# Patient Record
Sex: Female | Born: 1995
Health system: Southern US, Community
[De-identification: ages and names within clinical notes are randomized; demographics above are authoritative.]

## PROBLEM LIST (undated history)

## (undated) DIAGNOSIS — J45909 Unspecified asthma, uncomplicated: Secondary | ICD-10-CM

## (undated) HISTORY — PX: ADENOIDECTOMY: SUR15

## (undated) HISTORY — PX: OTHER SURGICAL HISTORY: SHX169

---
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
## 2016-07-23 DIAGNOSIS — M25532 Pain in left wrist: Secondary | ICD-10-CM | POA: Diagnosis not present

## 2016-07-30 DIAGNOSIS — M25532 Pain in left wrist: Secondary | ICD-10-CM | POA: Diagnosis not present

## 2016-08-06 DIAGNOSIS — M778 Other enthesopathies, not elsewhere classified: Secondary | ICD-10-CM | POA: Diagnosis not present

## 2016-08-13 DIAGNOSIS — M25532 Pain in left wrist: Secondary | ICD-10-CM | POA: Diagnosis not present

## 2016-09-15 DIAGNOSIS — D225 Melanocytic nevi of trunk: Secondary | ICD-10-CM | POA: Diagnosis not present

## 2016-09-15 DIAGNOSIS — L814 Other melanin hyperpigmentation: Secondary | ICD-10-CM | POA: Diagnosis not present

## 2016-09-15 DIAGNOSIS — D1801 Hemangioma of skin and subcutaneous tissue: Secondary | ICD-10-CM | POA: Diagnosis not present

## 2016-10-05 DIAGNOSIS — N390 Urinary tract infection, site not specified: Secondary | ICD-10-CM | POA: Diagnosis not present

## 2017-01-08 DIAGNOSIS — H6692 Otitis media, unspecified, left ear: Secondary | ICD-10-CM | POA: Diagnosis not present

## 2017-01-08 DIAGNOSIS — J00 Acute nasopharyngitis [common cold]: Secondary | ICD-10-CM | POA: Diagnosis not present

## 2017-01-08 DIAGNOSIS — R05 Cough: Secondary | ICD-10-CM | POA: Diagnosis not present

## 2017-08-27 DIAGNOSIS — L309 Dermatitis, unspecified: Secondary | ICD-10-CM | POA: Diagnosis not present

## 2017-08-31 DIAGNOSIS — Z01419 Encounter for gynecological examination (general) (routine) without abnormal findings: Secondary | ICD-10-CM | POA: Diagnosis not present

## 2017-09-07 ENCOUNTER — Emergency Department (HOSPITAL_COMMUNITY): Payer: 59

## 2017-09-07 ENCOUNTER — Emergency Department (HOSPITAL_COMMUNITY)
Admission: EM | Admit: 2017-09-07 | Discharge: 2017-09-07 | Disposition: A | Discharge status: Home / Self Care | Payer: 59 | Attending: Emergency Medicine | Admitting: Emergency Medicine

## 2017-09-07 ENCOUNTER — Encounter (HOSPITAL_COMMUNITY): Payer: Self-pay | Admitting: Emergency Medicine

## 2017-09-07 ENCOUNTER — Other Ambulatory Visit: Payer: Self-pay

## 2017-09-07 DIAGNOSIS — N831 Corpus luteum cyst of ovary, unspecified side: Secondary | ICD-10-CM

## 2017-09-07 DIAGNOSIS — N8311 Corpus luteum cyst of right ovary: Secondary | ICD-10-CM | POA: Diagnosis not present

## 2017-09-07 DIAGNOSIS — Z79899 Other long term (current) drug therapy: Secondary | ICD-10-CM | POA: Diagnosis not present

## 2017-09-07 DIAGNOSIS — R109 Unspecified abdominal pain: Secondary | ICD-10-CM | POA: Diagnosis not present

## 2017-09-07 DIAGNOSIS — R1030 Lower abdominal pain, unspecified: Secondary | ICD-10-CM | POA: Diagnosis not present

## 2017-09-07 LAB — COMPREHENSIVE METABOLIC PANEL
ALT: 19 U/L (ref 14–54)
AST: 19 U/L (ref 15–41)
Albumin: 4.2 g/dL (ref 3.5–5.0)
Alkaline Phosphatase: 52 U/L (ref 38–126)
Anion gap: 10 (ref 5–15)
BUN: 17 mg/dL (ref 6–20)
CHLORIDE: 102 mmol/L (ref 101–111)
CO2: 23 mmol/L (ref 22–32)
Calcium: 9.2 mg/dL (ref 8.9–10.3)
Creatinine, Ser: 0.8 mg/dL (ref 0.44–1.00)
GFR calc non Af Amer: >60 mL/min (ref >60)
GLUCOSE: 94 mg/dL (ref 65–99)
Potassium: 3.9 mmol/L (ref 3.5–5.1)
SODIUM: 135 mmol/L (ref 135–145)
Total Bilirubin: 0.5 mg/dL (ref 0.3–1.2)
Total Protein: 7.7 g/dL (ref 6.5–8.1)

## 2017-09-07 LAB — LIPASE, BLOOD: LIPASE: 29 U/L (ref 11–51)

## 2017-09-07 LAB — URINALYSIS, ROUTINE W REFLEX MICROSCOPIC
BILIRUBIN URINE: NEGATIVE
Glucose, UA: NEGATIVE mg/dL
Hgb urine dipstick: NEGATIVE
Ketones, ur: NEGATIVE mg/dL
Nitrite: NEGATIVE
Protein, ur: NEGATIVE mg/dL
SPECIFIC GRAVITY, URINE: 1.014 (ref 1.005–1.030)
pH: 6 (ref 5.0–8.0)

## 2017-09-07 LAB — WET PREP, GENITAL
CLUE CELLS WET PREP: NONE SEEN
Trich, Wet Prep: NONE SEEN
Yeast Wet Prep HPF POC: NONE SEEN

## 2017-09-07 LAB — CBC
HEMATOCRIT: 38.8 % (ref 36.0–46.0)
Hemoglobin: 13.2 g/dL (ref 12.0–15.0)
MCH: 29.8 pg (ref 26.0–34.0)
MCHC: 34 g/dL (ref 30.0–36.0)
MCV: 87.6 fL (ref 78.0–100.0)
Platelets: 283 10*3/uL (ref 150–400)
RBC: 4.43 MIL/uL (ref 3.87–5.11)
RDW: 12.1 % (ref 11.5–15.5)
WBC: 7.6 10*3/uL (ref 4.0–10.5)

## 2017-09-07 LAB — I-STAT BETA HCG BLOOD, ED (MC, WL, AP ONLY): I-stat hCG, quantitative: <5 m[IU]/mL (ref <5)

## 2017-09-07 MED ORDER — SODIUM CHLORIDE 0.9 % IV BOLUS
500.0000 mL | Freq: Once | INTRAVENOUS | Status: AC
  Administered 2017-09-07: 500 mL via INTRAVENOUS

## 2017-09-07 MED ORDER — ONDANSETRON HCL 4 MG/2ML IJ SOLN
4.0000 mg | Freq: Once | INTRAMUSCULAR | Status: AC
  Administered 2017-09-07: 4 mg via INTRAVENOUS
  Filled 2017-09-07: qty 2

## 2017-09-07 MED ORDER — IOPAMIDOL (ISOVUE-300) INJECTION 61%
100.0000 mL | Freq: Once | INTRAVENOUS | Status: AC | PRN
  Administered 2017-09-07: 100 mL via INTRAVENOUS

## 2017-09-07 MED ORDER — MORPHINE SULFATE (PF) 4 MG/ML IV SOLN
4.0000 mg | Freq: Once | INTRAVENOUS | Status: AC
  Administered 2017-09-07: 4 mg via INTRAVENOUS
  Filled 2017-09-07: qty 1

## 2017-09-07 MED ORDER — IOPAMIDOL (ISOVUE-300) INJECTION 61%
INTRAVENOUS | Status: AC
  Filled 2017-09-07: qty 100

## 2017-09-07 NOTE — ED Notes (Signed)
Patient transported to CT 

## 2017-09-07 NOTE — ED Triage Notes (Signed)
Pt is c/o lower abd pain that started tonight around 1am Pt states it is mainly on the right and in the center  Pt has had one episode of vomiting and is c/o nausea  Pt states the pain is worse when she walks

## 2017-09-07 NOTE — Discharge Instructions (Addendum)
CT confirms ruptured and bleeding of corpus luteum cyst. This is most likely cause of pain.   There are no signs of complication such as infection, vaginal bleeding.   These cysts typically reabsorb within 24-48 hours and pain slowly improved. Take tylenol for pain. Pelvic rest until pain resolved.   Follow up with OBGYN if pain persists. Return to ER if you have heavy vaginal bleeding, worsening pain, fevers, chills.

## 2017-09-07 NOTE — ED Provider Notes (Signed)
[facility] DEPT Provider Note   CSN: [id number] Arrival date & time: 09/07/17  0209     History   Chief Complaint Chief Complaint  Patient presents with  . Abdominal Pain    HPI Brenda Robles is a 22 y.o. female with no past medical history is here for evaluation of sudden onset lower abdominal pain onset this morning around 1 AM, it woke her up from her sleep.  Pain is constant but intermittently intensifies, achy, moderate.  Nonradiating.  Associated symptoms include nausea, nonbilious nonbloody emesis x1.  Aggravating factors include movement, walking, palpation.  No interventions PTA.  No alleviating factors.  No previous history of abdominal surgeries.  She is on Mirena.  LMP 2016.  She is sexually active with one female partner without condom use.  She denies fevers, chest pain, shortness of breath, dysuria, hematuria, frequency, urgency, changes in bowel movements, increased  or abnormal vaginal discharge or bleeding.  No history of STDs.  No history of ovarian cysts, PID, fibroids.  HPI  History reviewed. No pertinent past medical history.  There are no active problems to display for this patient.   Past Surgical History:  Procedure Laterality Date  . ADENOIDECTOMY    . extraction of wisdom teeth       OB History   None      Home Medications    Prior to Admission medications   Medication Sig Start Date End Date Taking? Authorizing Provider  Ascorbic Acid (VITAMIN C PO) Take 1 tablet by mouth daily.   Yes [provider]  Biotin w/ Vitamins C & E (HAIR/SKIN/NAILS PO) Take 1 capsule by mouth daily.   Yes [provider]  Multiple Vitamin (ONE-DAILY MULTI-VITAMIN) PACK Take 1 each by mouth daily. Pack of 7 vitamins   Yes [provider]    Family History Family History  Problem Relation Age of Onset  . Seizures Brother     Social History Social History   Tobacco Use  . Smoking status: Never Smoker    . Smokeless tobacco: Never Used  Substance Use Topics  . Alcohol use: Yes    Comment: slight  . Drug use: Never     Allergies   Biaxin [clarithromycin] and Sulfa antibiotics   Review of Systems Review of Systems  Gastrointestinal: Positive for abdominal pain, nausea and vomiting.  All other systems reviewed and are negative.    Physical Exam Updated Vital Signs BP 99/62   Pulse (!) 58   Temp (!) 97.4 F (36.3 C) (Oral)   Resp 16   Ht 5\' 10"  (1.778 m)   Wt 81.6 kg (180 lb)   SpO2 98%   BMI 25.83 kg/m   Physical Exam  Constitutional: She is oriented to person, place, and time. She appears well-developed and well-nourished. No distress.  Non toxic  HENT:  Head: Normocephalic and atraumatic.  Nose: Nose normal.  Moist mucous membranes   Eyes: Pupils are equal, round, and reactive to light. Conjunctivae and EOM are normal.  Neck: Normal range of motion.  Cardiovascular: Normal rate and regular rhythm.  Pulmonary/Chest: Effort normal and breath sounds normal.  Abdominal: Soft. Bowel sounds are normal. There is tenderness in the right lower quadrant and suprapubic area.  Positive McBurney's. Suprapubic tenderness. No G/R/R. No CVAT. Negative Murphy's. No distention.   Genitourinary:  Genitourinary Comments: External genitalia normal without erythema, edema, tenderness, discharge or lesions.  No groin lymphadenopathy.  Vaginal mucosa and cervix normal, pink without discharge  or lesions.  Uterus in midline, smooth, not enlarged or tender. No CMT. Non palpable adnexa.  Musculoskeletal: Normal range of motion.  Neurological: She is alert and oriented to person, place, and time.  Skin: Skin is warm and dry. Capillary refill takes less than 2 seconds.  Psychiatric: She has a normal mood and affect. Her behavior is normal.  Nursing note and vitals reviewed.    ED Treatments / Results  Labs (all labs ordered are listed, but only abnormal results are displayed) Labs  Reviewed  WET PREP, GENITAL - Abnormal; Notable for the following components:      Result Value   WBC, Wet Prep HPF POC MANY (*)    All other components within normal limits  URINALYSIS, ROUTINE W REFLEX MICROSCOPIC - Abnormal; Notable for the following components:   APPearance HAZY (*)    Leukocytes, UA TRACE (*)    Bacteria, UA RARE (*)    All other components within normal limits  LIPASE, BLOOD  COMPREHENSIVE METABOLIC PANEL  CBC  I-STAT BETA HCG BLOOD, ED ([facility], [facility], [facility] ONLY)  GC/CHLAMYDIA PROBE AMP ([place]) NOT AT [facility]    EKG None  Radiology Ct Abdomen Pelvis W Contrast  Result Date: 09/07/2017 CLINICAL DATA:  Lower abdominal pain. EXAM: CT ABDOMEN AND PELVIS WITH CONTRAST TECHNIQUE: Multidetector CT imaging of the abdomen and pelvis was performed using the standard protocol following bolus administration of intravenous contrast. CONTRAST:  135mL ISOVUE-300 IOPAMIDOL (ISOVUE-300) INJECTION 61% COMPARISON:  None. FINDINGS: Lower chest: No acute abnormality. Minimal left greater than right bibasilar atelectasis. Hepatobiliary: Trace focal fat along the falciform ligament. No other focal liver abnormality. The gallbladder is unremarkable. No biliary dilatation. Pancreas: Unremarkable. No pancreatic ductal dilatation or surrounding inflammatory changes. Spleen: Normal in size without focal abnormality. Adrenals/Urinary Tract: Adrenal glands are unremarkable. Kidneys are normal, without renal calculi, focal lesion, or hydronephrosis. Bladder is unremarkable. Stomach/Bowel: Stomach is within normal limits. Appendix appears normal. No evidence of bowel wall thickening, distention, or inflammatory changes. Vascular/Lymphatic: No significant vascular findings are present. No enlarged abdominal or pelvic lymph nodes. Reproductive: IUD appropriately positioned within the endometrial canal. The left ovary is unremarkable. Right corpus luteum with small amount of surrounding intermediate density  fluid extending into the right paracolic gutter. Other: No abdominal wall hernia.  No pneumoperitoneum. Musculoskeletal: No acute or significant osseous findings. IMPRESSION: 1. Right ovarian corpus luteum with small amount of adjacent intermediate density fluid extending into the right paracolic gutter, most consistent with a ruptured hemorrhagic corpus luteum. 2. Normal appendix. Electronically Signed   By: Titus Dubin M.D.   On: 09/07/2017 09:52    Procedures Procedures (including critical care time)  Medications Ordered in ED Medications  iopamidol (ISOVUE-300) 61 % injection (has no administration in time range)  morphine 4 MG/ML injection 4 mg (4 mg Intravenous Given 09/07/17 0848)  ondansetron (ZOFRAN) injection 4 mg (4 mg Intravenous Given 09/07/17 0848)  sodium chloride 0.9 % bolus 500 mL (0 mLs Intravenous Stopped 09/07/17 0950)  iopamidol (ISOVUE-300) 61 % injection 100 mL (100 mLs Intravenous Contrast Given 09/07/17 0930)     Initial Impression / Assessment and Plan / ED Course  I have reviewed the triage vital signs and the nursing notes.  Pertinent labs & imaging results that were available during my care of the patient were reviewed by me and considered in my medical decision making (see chart for details).  Clinical Course as of Sep 07 1105  Tue Sep 07, 2017  [place]  Leukocytes, UA(!): TRACE [CG]  0702 WBC, UA: 6-10 [CG]  1058 IMPRESSION: 1. Right ovarian corpus luteum with small amount of adjacent intermediate density fluid extending into the right paracolic gutter, most consistent with a ruptured hemorrhagic corpus luteum. 2. Normal appendix.  CT ABDOMEN PELVIS W CONTRAST [CG]    Clinical Course User Index [CG] Kinnie Feil, PA-C   Ddx includes UTI, appendicitis, torsion, ectopic, right sided diverticulitis, hemorrhagic ovarian cyst.   0800: Labs unremarkable. Hcg negative. UA with trace leukocytes, 6-10 WBC, rare bacteria, 0-5 sq ep, mucus and sperm present.  Pelvic exam pending anticipate [place] vs CTAP.   Final Clinical Impressions(s) / ED Diagnoses   1100: CT confirmed ruptured right-sided corpus luteum cyst.  Appendix is normal.  Repeat abdominal exam has improved.  No episodes of emesis in the ER.  She is tolerating p.o.  This time, will discharge with conservative management including NSAIDs, pelvic rest.  Discussed possible complications of cyst including infection, fevers, chills, worsening pain, vaginal bleeding.  She is aware of symptoms that would warrant return to the ER.  Follow-up with OB/GYN on outpatient basis for persistent pain.  Patient and mother bedside in agreement with the ER treatment and discharge plan. Final diagnoses:  Corpus luteum cyst rupture    ED Discharge Orders    None       Arlean Hopping 09/07/17 1107    Dorie Rank, MD 09/08/17 [phone]

## 2017-09-08 LAB — GC/CHLAMYDIA PROBE AMP (~~LOC~~) NOT AT ARMC
CHLAMYDIA, DNA PROBE: NEGATIVE
Neisseria Gonorrhea: NEGATIVE

## 2017-09-15 DIAGNOSIS — L812 Freckles: Secondary | ICD-10-CM | POA: Diagnosis not present

## 2017-09-15 DIAGNOSIS — D225 Melanocytic nevi of trunk: Secondary | ICD-10-CM | POA: Diagnosis not present

## 2017-09-15 DIAGNOSIS — D1801 Hemangioma of skin and subcutaneous tissue: Secondary | ICD-10-CM | POA: Diagnosis not present

## 2017-11-15 DIAGNOSIS — J029 Acute pharyngitis, unspecified: Secondary | ICD-10-CM | POA: Diagnosis not present

## 2017-11-15 DIAGNOSIS — H6692 Otitis media, unspecified, left ear: Secondary | ICD-10-CM | POA: Diagnosis not present

## 2017-12-13 DIAGNOSIS — R221 Localized swelling, mass and lump, neck: Secondary | ICD-10-CM | POA: Diagnosis not present

## 2017-12-13 DIAGNOSIS — Z87440 Personal history of urinary (tract) infections: Secondary | ICD-10-CM | POA: Diagnosis not present

## 2017-12-16 ENCOUNTER — Other Ambulatory Visit: Payer: Self-pay | Admitting: Family Medicine

## 2017-12-16 DIAGNOSIS — R221 Localized swelling, mass and lump, neck: Secondary | ICD-10-CM

## 2017-12-27 ENCOUNTER — Inpatient Hospital Stay
Admission: RE | Admit: 2017-12-27 | Discharge: 2017-12-27 | Disposition: A | Discharge status: Home / Self Care | Payer: 59 | Source: Ambulatory Visit | Attending: Family Medicine | Admitting: Family Medicine

## 2018-01-21 DIAGNOSIS — M5116 Intervertebral disc disorders with radiculopathy, lumbar region: Secondary | ICD-10-CM | POA: Diagnosis not present

## 2018-01-21 DIAGNOSIS — M9903 Segmental and somatic dysfunction of lumbar region: Secondary | ICD-10-CM | POA: Diagnosis not present

## 2018-01-21 DIAGNOSIS — M9905 Segmental and somatic dysfunction of pelvic region: Secondary | ICD-10-CM | POA: Diagnosis not present

## 2018-01-24 DIAGNOSIS — M9903 Segmental and somatic dysfunction of lumbar region: Secondary | ICD-10-CM | POA: Diagnosis not present

## 2018-01-24 DIAGNOSIS — M5116 Intervertebral disc disorders with radiculopathy, lumbar region: Secondary | ICD-10-CM | POA: Diagnosis not present

## 2018-01-24 DIAGNOSIS — M9905 Segmental and somatic dysfunction of pelvic region: Secondary | ICD-10-CM | POA: Diagnosis not present

## 2018-01-26 DIAGNOSIS — M9903 Segmental and somatic dysfunction of lumbar region: Secondary | ICD-10-CM | POA: Diagnosis not present

## 2018-01-26 DIAGNOSIS — M9905 Segmental and somatic dysfunction of pelvic region: Secondary | ICD-10-CM | POA: Diagnosis not present

## 2018-01-26 DIAGNOSIS — M5116 Intervertebral disc disorders with radiculopathy, lumbar region: Secondary | ICD-10-CM | POA: Diagnosis not present

## 2018-01-28 DIAGNOSIS — M5116 Intervertebral disc disorders with radiculopathy, lumbar region: Secondary | ICD-10-CM | POA: Diagnosis not present

## 2018-01-28 DIAGNOSIS — M9905 Segmental and somatic dysfunction of pelvic region: Secondary | ICD-10-CM | POA: Diagnosis not present

## 2018-01-28 DIAGNOSIS — M9903 Segmental and somatic dysfunction of lumbar region: Secondary | ICD-10-CM | POA: Diagnosis not present

## 2018-02-01 DIAGNOSIS — M9903 Segmental and somatic dysfunction of lumbar region: Secondary | ICD-10-CM | POA: Diagnosis not present

## 2018-02-01 DIAGNOSIS — M5116 Intervertebral disc disorders with radiculopathy, lumbar region: Secondary | ICD-10-CM | POA: Diagnosis not present

## 2018-02-01 DIAGNOSIS — M9905 Segmental and somatic dysfunction of pelvic region: Secondary | ICD-10-CM | POA: Diagnosis not present

## 2018-02-03 DIAGNOSIS — M9903 Segmental and somatic dysfunction of lumbar region: Secondary | ICD-10-CM | POA: Diagnosis not present

## 2018-02-03 DIAGNOSIS — M5116 Intervertebral disc disorders with radiculopathy, lumbar region: Secondary | ICD-10-CM | POA: Diagnosis not present

## 2018-02-03 DIAGNOSIS — M9905 Segmental and somatic dysfunction of pelvic region: Secondary | ICD-10-CM | POA: Diagnosis not present

## 2018-04-25 DIAGNOSIS — Z124 Encounter for screening for malignant neoplasm of cervix: Secondary | ICD-10-CM | POA: Diagnosis not present

## 2018-04-25 DIAGNOSIS — N926 Irregular menstruation, unspecified: Secondary | ICD-10-CM | POA: Diagnosis not present

## 2018-04-25 DIAGNOSIS — T8389XA Other specified complication of genitourinary prosthetic devices, implants and grafts, initial encounter: Secondary | ICD-10-CM | POA: Diagnosis not present

## 2018-04-25 DIAGNOSIS — Z30431 Encounter for routine checking of intrauterine contraceptive device: Secondary | ICD-10-CM | POA: Diagnosis not present

## 2018-04-25 DIAGNOSIS — R102 Pelvic and perineal pain: Secondary | ICD-10-CM | POA: Diagnosis not present

## 2018-07-19 DIAGNOSIS — Z0000 Encounter for general adult medical examination without abnormal findings: Secondary | ICD-10-CM | POA: Diagnosis not present

## 2018-07-19 DIAGNOSIS — Z136 Encounter for screening for cardiovascular disorders: Secondary | ICD-10-CM | POA: Diagnosis not present

## 2018-07-19 DIAGNOSIS — Z130 Encounter for screening for diseases of the blood and blood-forming organs and certain disorders involving the immune mechanism: Secondary | ICD-10-CM | POA: Diagnosis not present

## 2018-07-19 DIAGNOSIS — Z23 Encounter for immunization: Secondary | ICD-10-CM | POA: Diagnosis not present

## 2018-09-25 IMAGING — CT CT ABD-PELV W/ CM
2 of 4 series · 16 of 46 positions shown, 18 images · IV contrast (iopamidol)
Comparison: None.

CLINICAL DATA: Lower abdominal pain.

EXAM:
CT ABDOMEN AND PELVIS WITH CONTRAST
TECHNIQUE: Multidetector CT imaging of the abdomen and pelvis was performed
using the standard protocol following bolus administration of
intravenous contrast.
CONTRAST:  100mL O2GSPB-J00 IOPAMIDOL (O2GSPB-J00) INJECTION 61%

[Series 2: axial st · axial · 0.79mm/px · z∈[+832,+1267]mm · 13 of 97 slices shown, 15 images]
[im 5/97  soft-tissue]
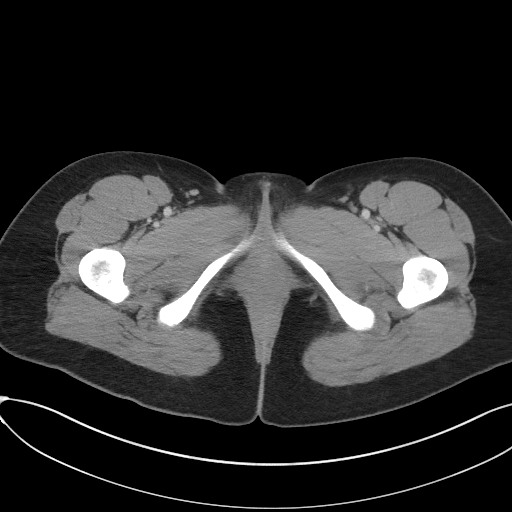
[im 5/97  bone]
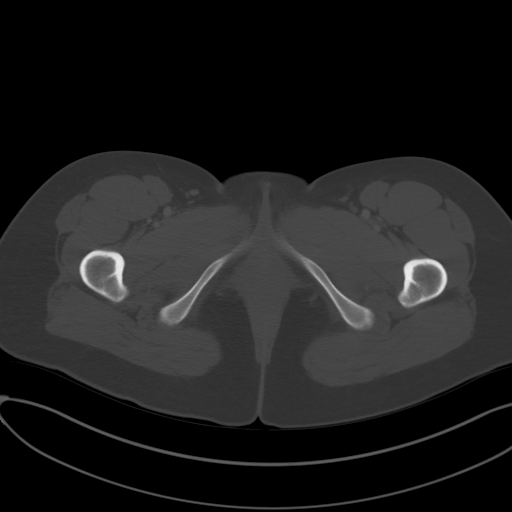
[im 15/97  soft-tissue]
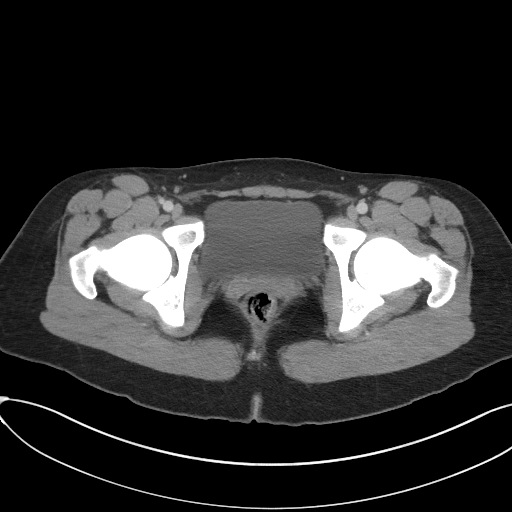
[im 20/97  soft-tissue]
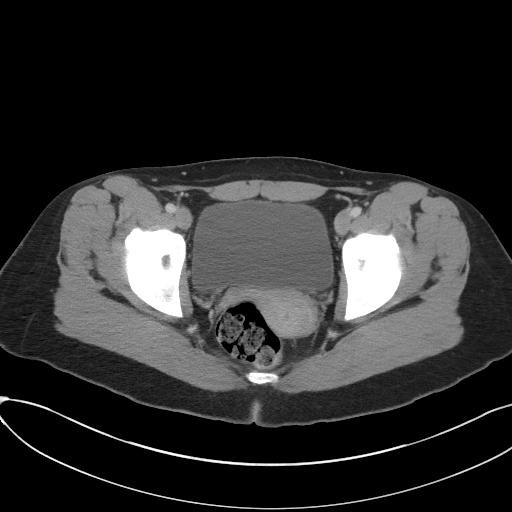
[im 29/97  soft-tissue]
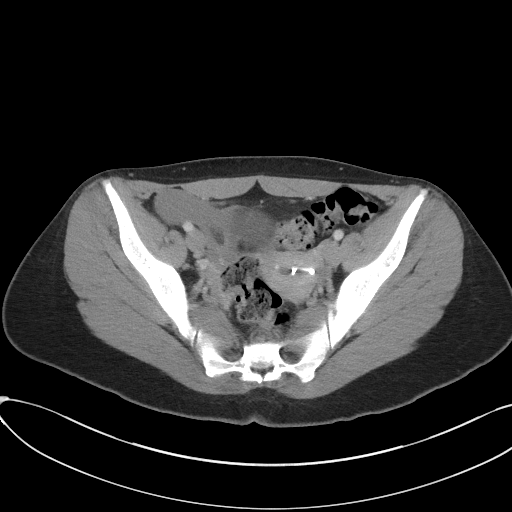
[im 34/97  soft-tissue]
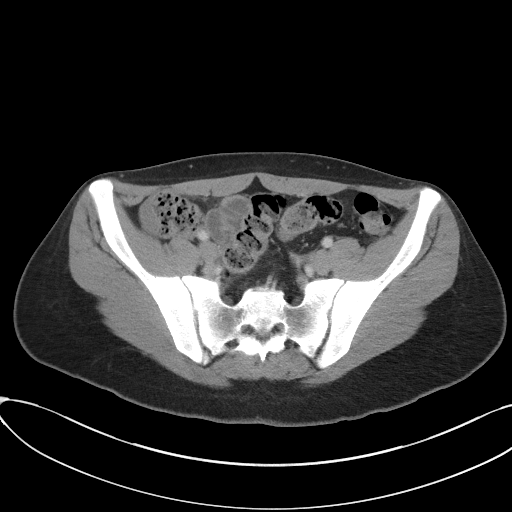
[im 44/97  soft-tissue]
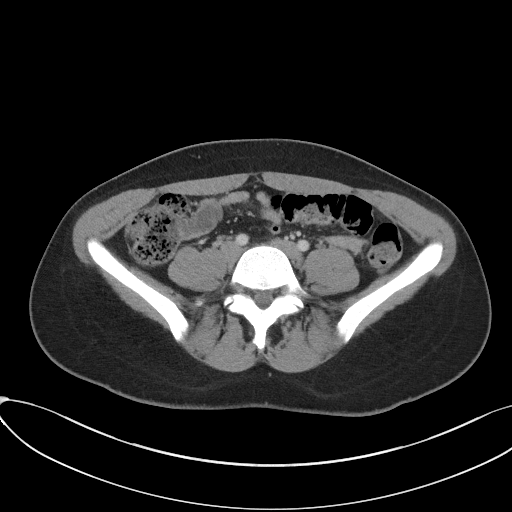
[im 49/97  soft-tissue]
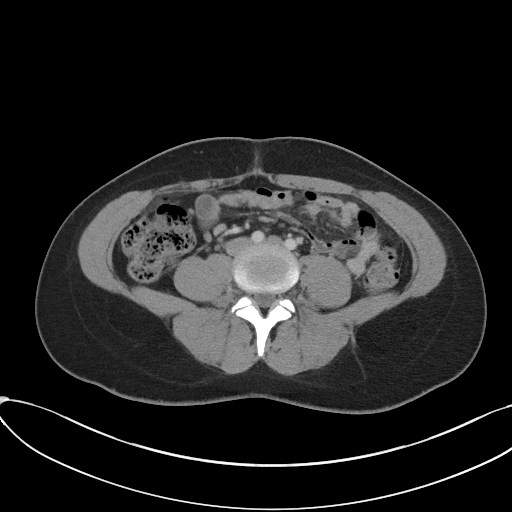
[im 53/97  soft-tissue]
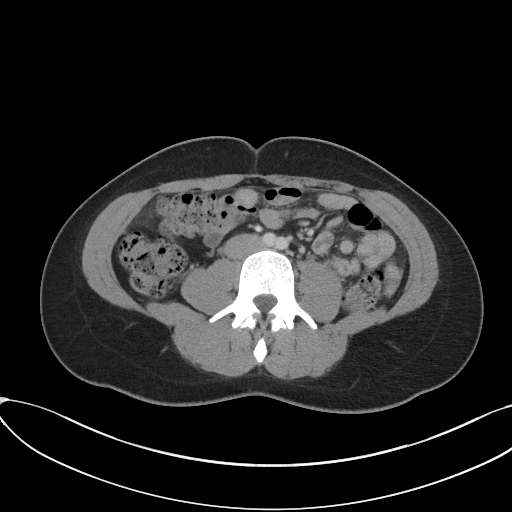
[im 63/97  soft-tissue]
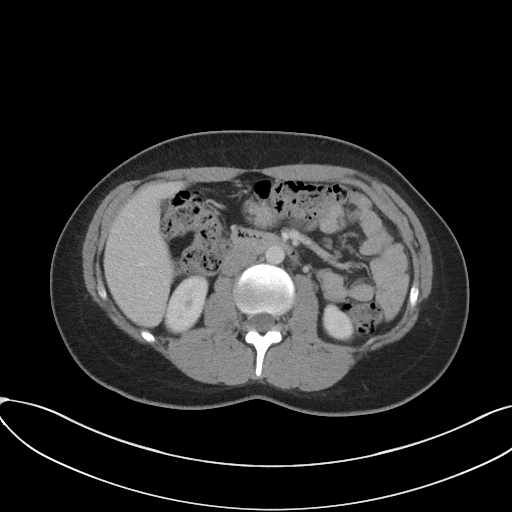
[im 63/97  bone]
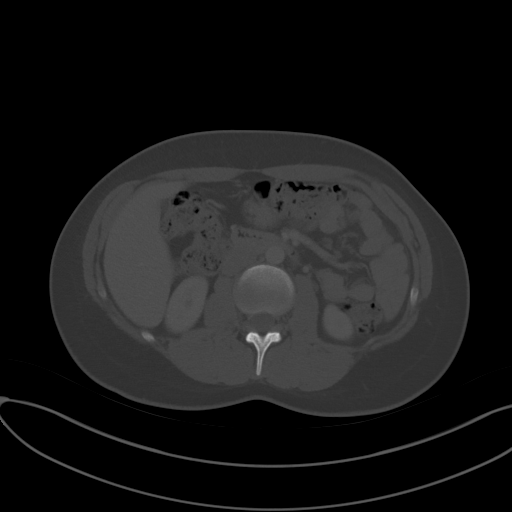
[im 68/97  soft-tissue]
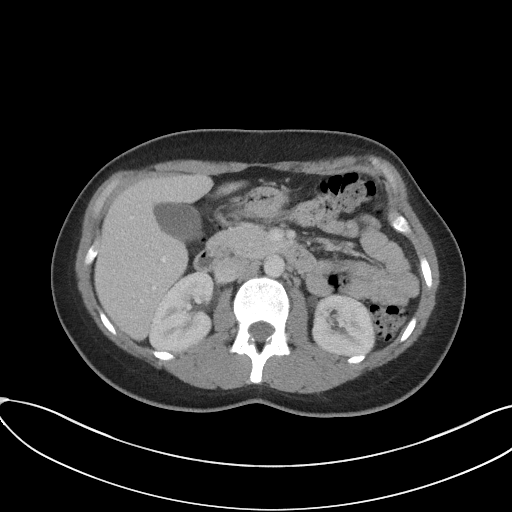
[im 77/97  soft-tissue]
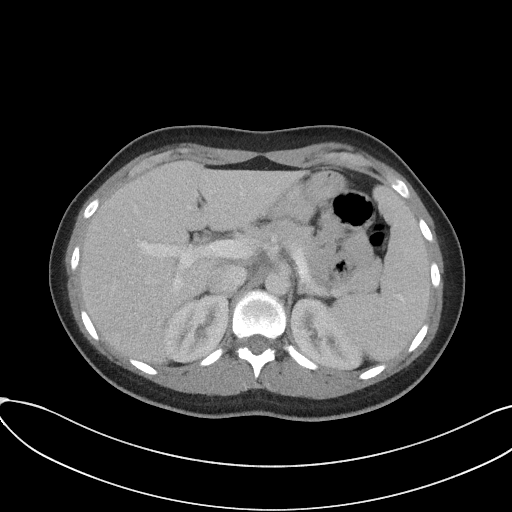
[im 82/97  soft-tissue]
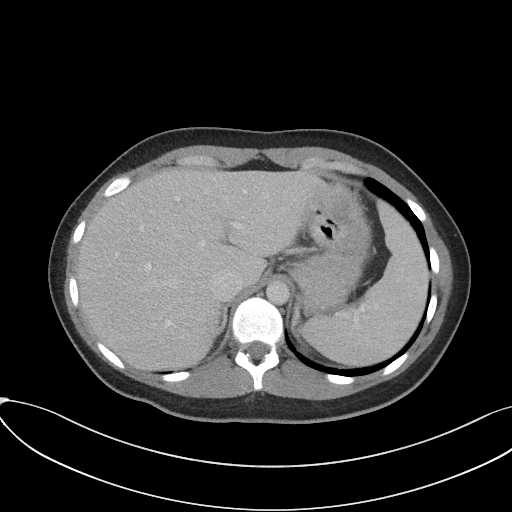
[im 92/97  soft-tissue]
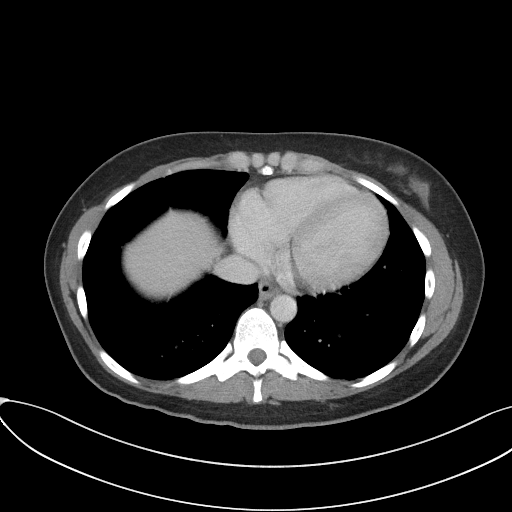

[Series 5: coronal st · coronal · 0.81mm/px · 3 of 77 slices shown]
[im 26/77  soft-tissue]
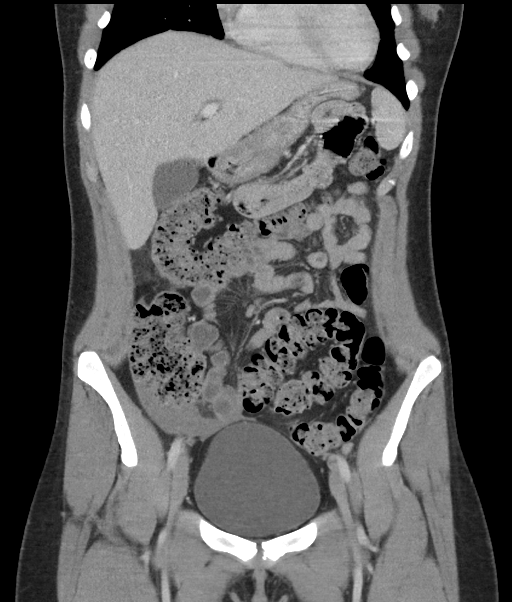
[im 34/77  soft-tissue]
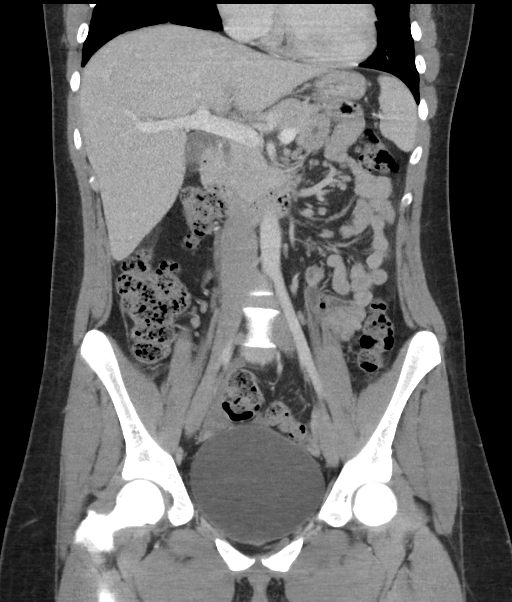
[im 43/77  soft-tissue]
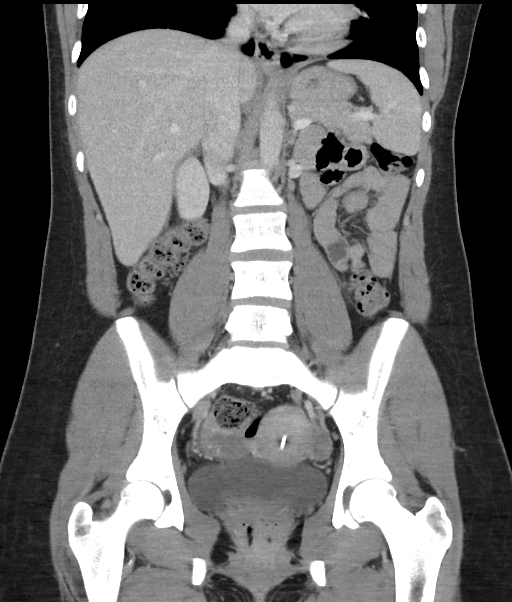

[16 of 46 positions shown; findings below may reference images not displayed]

FINDINGS: Lower chest: No acute abnormality. Minimal left greater than right
bibasilar atelectasis.

Hepatobiliary: Trace focal fat along the falciform ligament. No
other focal liver abnormality. The gallbladder is unremarkable. No
biliary dilatation.

Pancreas: Unremarkable. No pancreatic ductal dilatation or
surrounding inflammatory changes.

Spleen: Normal in size without focal abnormality.

Adrenals/Urinary Tract: Adrenal glands are unremarkable. Kidneys are
normal, without renal calculi, focal lesion, or hydronephrosis.
Bladder is unremarkable.

Stomach/Bowel: Stomach is within normal limits. Appendix appears
normal. No evidence of bowel wall thickening, distention, or
inflammatory changes.

Vascular/Lymphatic: No significant vascular findings are present. No
enlarged abdominal or pelvic lymph nodes.

Reproductive: IUD appropriately positioned within the endometrial
canal. The left ovary is unremarkable. Right corpus luteum with
small amount of surrounding intermediate density fluid extending
into the right paracolic gutter.

Other: No abdominal wall hernia.  No pneumoperitoneum.

Musculoskeletal: No acute or significant osseous findings.
IMPRESSION: 1. Right ovarian corpus luteum with small amount of adjacent
intermediate density fluid extending into the right paracolic
gutter, most consistent with a ruptured hemorrhagic corpus luteum.
2. Normal appendix.

## 2019-05-01 ENCOUNTER — Ambulatory Visit: Payer: No Typology Code available for payment source | Attending: Internal Medicine

## 2019-05-01 DIAGNOSIS — Z20822 Contact with and (suspected) exposure to covid-19: Secondary | ICD-10-CM

## 2019-05-03 LAB — NOVEL CORONAVIRUS, NAA: SARS-CoV-2, NAA: NOT DETECTED

## 2020-08-21 ENCOUNTER — Other Ambulatory Visit: Payer: Self-pay

## 2020-08-21 ENCOUNTER — Encounter (HOSPITAL_COMMUNITY): Payer: Self-pay

## 2020-08-21 ENCOUNTER — Emergency Department (HOSPITAL_COMMUNITY)
Admission: EM | Admit: 2020-08-21 | Discharge: 2020-08-22 | Disposition: A | Discharge status: Home / Self Care | Payer: No Typology Code available for payment source | Attending: Emergency Medicine | Admitting: Emergency Medicine

## 2020-08-21 DIAGNOSIS — E876 Hypokalemia: Secondary | ICD-10-CM | POA: Diagnosis not present

## 2020-08-21 DIAGNOSIS — R600 Localized edema: Secondary | ICD-10-CM | POA: Insufficient documentation

## 2020-08-21 DIAGNOSIS — M79605 Pain in left leg: Secondary | ICD-10-CM | POA: Diagnosis present

## 2020-08-21 LAB — CBC WITH DIFFERENTIAL/PLATELET
Abs Immature Granulocytes: 0.01 10*3/uL (ref 0.00–0.07)
Basophils Absolute: 0.1 10*3/uL (ref 0.0–0.1)
Basophils Relative: 1 %
Eosinophils Absolute: 0.2 10*3/uL (ref 0.0–0.5)
Eosinophils Relative: 3 %
HCT: 37.6 % (ref 36.0–46.0)
Hemoglobin: 12.4 g/dL (ref 12.0–15.0)
Immature Granulocytes: 0 %
Lymphocytes Relative: 40 %
Lymphs Abs: 2.5 10*3/uL (ref 0.7–4.0)
MCH: 29 pg (ref 26.0–34.0)
MCHC: 33 g/dL (ref 30.0–36.0)
MCV: 87.9 fL (ref 80.0–100.0)
Monocytes Absolute: 0.5 10*3/uL (ref 0.1–1.0)
Monocytes Relative: 8 %
Neutro Abs: 3 10*3/uL (ref 1.7–7.7)
Neutrophils Relative %: 48 %
Platelets: 342 10*3/uL (ref 150–400)
RBC: 4.28 MIL/uL (ref 3.87–5.11)
RDW: 11.9 % (ref 11.5–15.5)
WBC: 6.2 10*3/uL (ref 4.0–10.5)
nRBC: 0 % (ref 0.0–0.2)

## 2020-08-21 LAB — BASIC METABOLIC PANEL
Anion gap: 9 (ref 5–15)
BUN: 9 mg/dL (ref 6–20)
CO2: 23 mmol/L (ref 22–32)
Calcium: 9 mg/dL (ref 8.9–10.3)
Chloride: 106 mmol/L (ref 98–111)
Creatinine, Ser: 0.84 mg/dL (ref 0.44–1.00)
GFR, Estimated: >60 mL/min (ref >60)
Glucose, Bld: 97 mg/dL (ref 70–99)
Potassium: 3.4 mmol/L — ABNORMAL LOW (ref 3.5–5.1)
Sodium: 138 mmol/L (ref 135–145)

## 2020-08-21 NOTE — ED Triage Notes (Signed)
Patient states left leg and foot swelling/numbness x2 days.

## 2020-08-21 NOTE — ED Provider Notes (Signed)
Emergency Medicine Provider Triage Evaluation Note  Brenda Robles , a 25 y.o. female  was evaluated in triage.  Pt complains of left leg pain and swelling.  Over the past few days she has noticed swelling around her left foot and ankle.  She is also intermittently noticed some pain in the back of her calf.  Denies any trauma or injury to the leg.  She always has some pain in her knee due to sports injuries, but reports she does not usually have pain or swelling in the leg.  No prior history of blood clots.  No associated chest pain or shortness of breath.  Review of Systems  Positive: Leg pain, leg swelling  Negative: Chest pain, shortness of breath  Physical Exam  BP 128/82 (BP Location: Right Arm)   Pulse 63   Temp 98.6 F (37 C) (Oral)   Ht 5\' 10"  (1.778 m)   Wt 81 kg   SpO2 100%   BMI 25.62 kg/m  Gen:   Awake, no distress   Resp:  Normal effort  MSK:   Moves extremities without difficulty, slight swelling and some tenderness in the calf of the left leg, pain in calf with dorsiflexion   Medical Decision Making  Medically screening exam initiated at 9:42 PM.  Appropriate orders placed.  Brenda Robles was informed that the remainder of the evaluation will be completed by another provider, this initial triage assessment does not replace that evaluation, and the importance of remaining in the ED until their evaluation is complete.     Jacqlyn Larsen, PA-C 08/21/20 2145    Wyvonnia Dusky, MD 08/22/20 [phone]

## 2020-08-22 ENCOUNTER — Emergency Department (HOSPITAL_BASED_OUTPATIENT_CLINIC_OR_DEPARTMENT_OTHER)
Admit: 2020-08-22 | Discharge: 2020-08-22 | Disposition: A | Discharge status: Home / Self Care | Payer: No Typology Code available for payment source | Attending: Emergency Medicine | Admitting: Emergency Medicine

## 2020-08-22 DIAGNOSIS — M7989 Other specified soft tissue disorders: Secondary | ICD-10-CM

## 2020-08-22 NOTE — Progress Notes (Signed)
Left lower extremity venous duplex has been completed. Preliminary results can be found in CV Proc through chart review.  Results were given to [name] PA.  08/22/20 9:54 AM Brenda Robles RVT

## 2020-08-22 NOTE — Discharge Instructions (Addendum)
Please follow-up with your primary care provider regarding today's ED encounter for ongoing evaluation.  Recommend elevation of the left leg.  Recommend ibuprofen 600 mg every 6 hours or naproxen 500 mg twice daily in effort to help with pain and inflammation.  Suspect this is likely dependent edema.  Your potassium was very mildly low at 3.4.  Please eat potatoes and bananas and follow-up with your primary care provider for lab recheck.  Return to the ER seek immediate medical attention should experience any new or worsening symptoms

## 2020-08-22 NOTE — ED Provider Notes (Signed)
[facility] DEPT Provider Note   CSN: [id number] Arrival date & time: 08/21/20  2109     History Chief Complaint  Patient presents with  . Leg Pain    Brenda Robles is a 25 y.o. female with no relevant past medical history who presents the ED with a 2-day history of left leg pain and swelling.  On my examination, patient is companied by her mother was at bedside.  Evidently she developed left leg swelling and pain symptoms yesterday.  Acute onset.  Atraumatic.  She was at a baseball game with her mother and by the end of the day she had much more significant swelling relative to contralateral leg.  Her mother is a Marine scientist and was concerned for DVT.  Patient denies any fevers, chills, recent STI /vaginal discharge, history of IVDA, any obvious precipitating injury or trauma, or history of similar.  She denies any history of clots or clotting disorder, recent surgeries or immobilization, chest pain, shortness of breath.  She does state that the pain is described as "throbbing" and worse with ambulation.  Denies overlying skin changes.  Her only risk factor for DVT is an estrogen birth control patch.  She has not taken anything for pain and inflammation.  HPI     History reviewed. No pertinent past medical history.  There are no problems to display for this patient.   Past Surgical History:  Procedure Laterality Date  . ADENOIDECTOMY    . extraction of wisdom teeth       OB History   No obstetric history on file.     Family History  Problem Relation Age of Onset  . Seizures Brother     Social History   Tobacco Use  . Smoking status: Never Smoker  . Smokeless tobacco: Never Used  Substance Use Topics  . Alcohol use: Yes    Comment: slight  . Drug use: Never    Home Medications Prior to Admission medications   Medication Sig Start Date End Date Taking? Authorizing Provider  norelgestromin-ethinyl estradiol [name]) 150-35 MCG/24HR  transdermal patch Place 1 patch onto the skin every 21 ( twenty-one) days.   Yes [provider]    Allergies    Biaxin [clarithromycin], Sulfamethoxazole, and Sulfa antibiotics  Review of Systems   Review of Systems  All other systems reviewed and are negative.   Physical Exam Updated Vital Signs BP 114/84 (BP Location: Left Arm)   Pulse 64   Temp 98.6 F (37 C) (Oral)   Resp 15   Ht 5\' 10"  (1.778 m)   Wt 81 kg   SpO2 98%   BMI 25.62 kg/m   Physical Exam Vitals and nursing note reviewed. Exam conducted with a chaperone present.  Constitutional:      General: She is not in acute distress.    Appearance: Normal appearance. She is not ill-appearing.  HENT:     Head: Normocephalic and atraumatic.  Eyes:     General: No scleral icterus.    Conjunctiva/sclera: Conjunctivae normal.  Cardiovascular:     Rate and Rhythm: Normal rate.     Pulses: Normal pulses.  Pulmonary:     Effort: Pulmonary effort is normal. No respiratory distress.  Musculoskeletal:     Right lower leg: No edema.     Left lower leg: Edema present.     Comments: Mild left lower leg swelling relative to contralateral leg.  1-2+ pedal edema.  ROM of ankle, knee, and hip fully intact.  Strength intact against resistance.  Sensation intact throughout.  No phlegmasia.  No redness, warmth, or other overlying skin changes.  Skin:    General: Skin is dry.     Capillary Refill: Capillary refill takes less than 2 seconds.  Neurological:     General: No focal deficit present.     Mental Status: She is alert and oriented to person, place, and time.     GCS: GCS eye subscore is 4. GCS verbal subscore is 5. GCS motor subscore is 6.     Cranial Nerves: No cranial nerve deficit.     Sensory: No sensory deficit.     Motor: No weakness.     Coordination: Coordination normal.  Psychiatric:        Mood and Affect: Mood normal.        Behavior: Behavior normal.        Thought Content: Thought content normal.      ED Results / Procedures / Treatments   Labs (all labs ordered are listed, but only abnormal results are displayed) Labs Reviewed  BASIC METABOLIC PANEL - Abnormal; Notable for the following components:      Result Value   Potassium 3.4 (*)    All other components within normal limits  CBC WITH DIFFERENTIAL/PLATELET    EKG None  Radiology VAS [place] LOWER EXTREMITY VENOUS (DVT) ([facility] and [facility] 7a-7p)  Result Date: 08/22/2020  Lower Venous DVT Study Patient Name:  Brenda Robles  Date of Exam:   08/22/2020 Medical Rec #: [id number]      Accession #:    [id number] Date of Birth: [birth year]      Patient Gender: F Patient Age:   24Y Exam Location:  [facility] Procedure:      VAS [place] LOWER EXTREMITY VENOUS (DVT) Referring Phys: [id number] Aneeka Bowden L Latasia Silberstein --------------------------------------------------------------------------------  Indications: Swelling.  Risk Factors: None identified. Comparison Study: No prior studies. Performing Technologist: Oliver Hum RVT  Examination Guidelines: A complete evaluation includes B-mode imaging, spectral Doppler, color Doppler, and power Doppler as needed of all accessible portions of each vessel. Bilateral testing is considered an integral part of a complete examination. Limited examinations for reoccurring indications may be performed as noted. The reflux portion of the exam is performed with the patient in reverse Trendelenburg.  +-----+---------------+---------+-----------+----------+--------------+ RIGHTCompressibilityPhasicitySpontaneityPropertiesThrombus Aging +-----+---------------+---------+-----------+----------+--------------+ CFV  Full           Yes      Yes                                 +-----+---------------+---------+-----------+----------+--------------+   +---------+---------------+---------+-----------+----------+--------------+ LEFT     CompressibilityPhasicitySpontaneityPropertiesThrombus Aging  +---------+---------------+---------+-----------+----------+--------------+ CFV      Full           Yes      Yes                                 +---------+---------------+---------+-----------+----------+--------------+ SFJ      Full                                                        +---------+---------------+---------+-----------+----------+--------------+ FV Prox  Full                                                        +---------+---------------+---------+-----------+----------+--------------+  FV Mid   Full                                                        +---------+---------------+---------+-----------+----------+--------------+ FV DistalFull                                                        +---------+---------------+---------+-----------+----------+--------------+ PFV      Full                                                        +---------+---------------+---------+-----------+----------+--------------+ POP      Full           Yes      Yes                                 +---------+---------------+---------+-----------+----------+--------------+ PTV      Full                                                        +---------+---------------+---------+-----------+----------+--------------+ PERO     Full                                                        +---------+---------------+---------+-----------+----------+--------------+     Summary: RIGHT: - No evidence of common femoral vein obstruction.  LEFT: - There is no evidence of deep vein thrombosis in the lower extremity.  - No cystic structure found in the popliteal fossa.  *See table(s) above for measurements and observations.    Preliminary     Procedures Procedures   Medications Ordered in ED Medications - No data to display  ED Course  I have reviewed the triage vital signs and the nursing notes.  Pertinent labs & imaging results that were available during  my care of the patient were reviewed by me and considered in my medical decision making (see chart for details).    MDM Rules/Calculators/A&P                          Brenda Robles was evaluated in Emergency Department on 08/22/2020 for the symptoms described in the history of present illness. She was evaluated in the context of the global COVID-19 pandemic, which necessitated consideration that the patient might be at risk for infection with the SARS-CoV-2 virus that causes COVID-19. Institutional protocols and algorithms that pertain to the evaluation of patients at risk for COVID-19 are in a state of rapid change based on information released by regulatory bodies including the CDC and federal and state organizations. These policies and algorithms were  followed during the patient's care in the ED.  I personally reviewed patient's medical chart and all notes from triage and staff during today's encounter. I have also ordered and reviewed all labs and imaging that I felt to be medically necessary in the evaluation of this patient's complaints and with consideration of their physical exam. If needed, translation services were available and utilized.   Patient with 1 day history of left leg swelling and aching discomfort.  She denies any recent travel, surgery, immobilization, or any other risk factors aside from her contraceptive patch.    Her physical exam is underwhelming.  Mild swelling of lower extremity relative to contralateral side.  She is otherwise functionally neurovascularly intact.  No significant warmth or erythema, swelling, or diminished range of motion otherwise concerning for septic joint.  She denies any IVDA or recent STI.  There were no injuries or wounds noted.  She is overall well-appearing.  If she is positive for DVT, will encourage her to have coag studies on outpatient basis and will start her on Xarelto DVT pack.  If negative, she is also reasonable for outpatient follow-up with  primary care provider.  We will encourage elevation of the affected extremity along with ibuprofen as needed.  She denies pregnancy.  ER return precautions discussed.  Patient and mother voiced understanding and are agreeable to the plan.     Final Clinical Impression(s) / ED Diagnoses Final diagnoses:  Left leg pain  Hypokalemia    Rx / DC Orders ED Discharge Orders    None       Corena Herter, PA-C 08/22/20 1009    Daleen Bo, MD 08/22/20 1550

## 2020-10-15 ENCOUNTER — Other Ambulatory Visit: Payer: Self-pay | Admitting: Sports Medicine

## 2020-10-15 ENCOUNTER — Ambulatory Visit
Admission: RE | Admit: 2020-10-15 | Discharge: 2020-10-15 | Disposition: A | Discharge status: Home / Self Care | Payer: No Typology Code available for payment source | Source: Ambulatory Visit | Attending: Sports Medicine | Admitting: Sports Medicine

## 2020-10-15 DIAGNOSIS — M25572 Pain in left ankle and joints of left foot: Secondary | ICD-10-CM

## 2021-06-02 ENCOUNTER — Emergency Department (HOSPITAL_BASED_OUTPATIENT_CLINIC_OR_DEPARTMENT_OTHER): Payer: No Typology Code available for payment source | Admitting: Radiology

## 2021-06-02 ENCOUNTER — Emergency Department (HOSPITAL_BASED_OUTPATIENT_CLINIC_OR_DEPARTMENT_OTHER)
Admission: EM | Admit: 2021-06-02 | Discharge: 2021-06-02 | Disposition: A | Discharge status: Home / Self Care | Payer: No Typology Code available for payment source | Attending: Emergency Medicine | Admitting: Emergency Medicine

## 2021-06-02 ENCOUNTER — Other Ambulatory Visit: Payer: Self-pay

## 2021-06-02 ENCOUNTER — Encounter (HOSPITAL_BASED_OUTPATIENT_CLINIC_OR_DEPARTMENT_OTHER): Payer: Self-pay

## 2021-06-02 DIAGNOSIS — S0990XA Unspecified injury of head, initial encounter: Secondary | ICD-10-CM | POA: Insufficient documentation

## 2021-06-02 DIAGNOSIS — M542 Cervicalgia: Secondary | ICD-10-CM | POA: Insufficient documentation

## 2021-06-02 DIAGNOSIS — M79601 Pain in right arm: Secondary | ICD-10-CM | POA: Insufficient documentation

## 2021-06-02 DIAGNOSIS — W228XXA Striking against or struck by other objects, initial encounter: Secondary | ICD-10-CM | POA: Diagnosis not present

## 2021-06-02 DIAGNOSIS — S9001XA Contusion of right ankle, initial encounter: Secondary | ICD-10-CM | POA: Insufficient documentation

## 2021-06-02 DIAGNOSIS — Y9368 Activity, volleyball (beach) (court): Secondary | ICD-10-CM | POA: Diagnosis not present

## 2021-06-02 DIAGNOSIS — M25511 Pain in right shoulder: Secondary | ICD-10-CM | POA: Insufficient documentation

## 2021-06-02 MED ORDER — DIPHENHYDRAMINE HCL 25 MG PO CAPS
25.0000 mg | ORAL_CAPSULE | Freq: Once | ORAL | Status: AC
  Administered 2021-06-02: 25 mg via ORAL
  Filled 2021-06-02: qty 1

## 2021-06-02 MED ORDER — METOCLOPRAMIDE HCL 10 MG PO TABS
10.0000 mg | ORAL_TABLET | Freq: Once | ORAL | Status: AC
  Administered 2021-06-02: 10 mg via ORAL
  Filled 2021-06-02: qty 1

## 2021-06-02 MED ORDER — METOCLOPRAMIDE HCL 10 MG PO TABS
10.0000 mg | ORAL_TABLET | Freq: Three times a day (TID) | ORAL | 0 refills | Status: AC | PRN
Start: 2021-06-02 — End: ?

## 2021-06-02 NOTE — ED Triage Notes (Signed)
Patient here POV from Home with Headache.  States Saturday she hit her Head on the Ground when she was playing Volleyball. Pain is Posterior and Anterior Head.  Headache. Moderate Nausea. No Vomiting. Moderate Photosensitivity.    Also endorses Pain to Right Arm Shoulder, Hip, and Torso.   NAD Noted during Triage. A&Ox4. GCS 15. Ambulatory.

## 2021-06-02 NOTE — ED Provider Notes (Signed)
[facility] EMERGENCY DEPT Provider Note   CSN: [id number] Arrival date & time: 06/02/21  1513     History  Chief Complaint  Patient presents with   Head Injury    Brenda Robles is a 26 y.o. female.   Head Injury Patient is a 26 year old female presented to the emergency room today with complaints of headache light sensitivity moderate to mild nausea she denies any vomiting.  She states that she has pain in her right arm shoulder some achy pain in the back of her neck as well.  She states that 3 days ago she was playing softball and was tackled by a player who was attempting to move from 1 place to the next while she was in the [place].  She was knocked to the ground she was not knocked unconscious.  She has not had any vomiting.  Mother at bedside states that she has been less talkative and has been on her phone less.  Patient also states that her right ankle struck by a ball while playing softball yesterday.  She did play.  Again yesterday, 24 hours after her initial injury.  She is able to play the game operation normally does.  She does endorse a circumferential achy headache.     Home Medications Prior to Admission medications   Medication Sig Start Date End Date Taking? Authorizing Provider  metoCLOPramide (REGLAN) 10 MG tablet Take 1 tablet (10 mg total) by mouth every 8 (eight) hours as needed for nausea. 06/02/21  Yes Elpidia Karn, Kathleene Hazel, PA  norelgestromin-ethinyl estradiol [name]) 150-35 MCG/24HR transdermal patch Place 1 patch onto the skin every 21 ( twenty-one) days.    [provider]      Allergies    Biaxin [clarithromycin], Sulfamethoxazole, and Sulfa antibiotics    Review of Systems   Review of Systems  Physical Exam Updated Vital Signs BP (!) 101/55    Pulse 84    Temp 98 F (36.7 C)    Resp 18    Ht 5\' 10"  (1.778 m)    Wt 81 kg    SpO2 100%    BMI 25.62 kg/m  Physical Exam Vitals and nursing note reviewed.  Constitutional:       General: She is not in acute distress.    Comments: Pleasant well-appearing 26 year old.  In no acute distress.  Sitting comfortably in bed.  Able answer questions appropriately follow commands. No increased work of breathing. Speaking in full sentences.  Wearing sunglasses  HENT:     Head: Normocephalic and atraumatic.     Nose: Nose normal.  Eyes:     General: No scleral icterus. Cardiovascular:     Rate and Rhythm: Normal rate and regular rhythm.     Pulses: Normal pulses.     Heart sounds: Normal heart sounds.  Pulmonary:     Effort: Pulmonary effort is normal. No respiratory distress.     Breath sounds: No wheezing.  Abdominal:     Palpations: Abdomen is soft.     Tenderness: There is no abdominal tenderness.  Musculoskeletal:     Cervical back: Normal range of motion.     Right lower leg: No edema.     Left lower leg: No edema.     Comments: Tenderness palpation of right ankle of the medial malleolus.  No OTHER bony tenderness over joints or long bones of the upper and lower extremities.    No neck or back midline tenderness, step-off, deformity, or bruising. Able to turn  head left and right 45 degrees without difficulty.  Full range of motion of upper and lower extremity joints shown after palpation was conducted; with 5/5 symmetrical strength in upper and lower extremities. No chest wall tenderness, no facial or cranial tenderness.   Patient has intact sensation grossly in lower and upper extremities. Intact patellar and ankle reflexes. Patient able to ambulate without difficulty.  Radial and DP pulses palpated BL.     Skin:    General: Skin is warm and dry.     Capillary Refill: Capillary refill takes less than 2 seconds.  Neurological:     Mental Status: She is alert. Mental status is at baseline.  Psychiatric:        Mood and Affect: Mood normal.        Behavior: Behavior normal.    ED Results / Procedures / Treatments   Labs (all labs ordered are listed, but  only abnormal results are displayed) Labs Reviewed - No data to display  EKG None  Radiology DG Ankle Complete Right  Result Date: 06/02/2021 CLINICAL DATA:  Right ankle pain and bruising, recent injury EXAM: RIGHT ANKLE - COMPLETE 3+ VIEW COMPARISON:  None. FINDINGS: Slight ankle soft tissue swelling diffusely. Normal alignment. No acute osseous finding or fracture. Malleoli, talus and calcaneus appear intact. No joint abnormality. IMPRESSION: Minimal soft tissue swelling. No acute finding by plain radiography. Electronically Signed   By: Jerilynn Mages.  Shick M.D.   On: 06/02/2021 16:18    Procedures Procedures    Medications Ordered in ED Medications  metoCLOPramide (REGLAN) tablet 10 mg (10 mg Oral Given 06/02/21 1610)  diphenhydrAMINE (BENADRYL) capsule 25 mg (25 mg Oral Given 06/02/21 1611)    ED Course/ Medical Decision Making/ A&P                           Medical Decision Making Amount and/or Complexity of Data Reviewed Radiology: ordered.  Risk Prescription drug management.   Patient is a 26 year old female presented to the ER for some headaches light sensitivity and nausea over the past few days.  She states that 3 days ago she was tackled to the ground by a softball player during a game.  She did strike her head against the ground.  Was not knocked unconscious did not have any vomiting and ended up playing a softball game the next day however she states that she has had some headaches since as well as only sensitivity some neck discomfort as well  Patient is neurologically intact well-appearing on my exam she does notably have some right medial malleolar tenderness of the right ankle.  Obtain x-ray which shows some swelling but no fracture.  Recommend rice therapy.  Had a lengthy discussion with mom and patient about concussion which is almost certainly is.  Symptoms can certainly be present for extended period of time.  Recommend holding off on any physical/contact sports until she  can be cleared by PCP.  Benadryl and Reglan given for headache.  Discharged home with same.  Return precautions given.  Patient is ambulatory at time of discharge and well-appearing all questions answered the best my ability.   Final Clinical Impression(s) / ED Diagnoses Final diagnoses:  Injury of head, initial encounter  Contusion of right ankle, initial encounter    Rx / DC Orders ED Discharge Orders          Ordered    metoCLOPramide (REGLAN) 10 MG tablet  Every 8 hours PRN  06/02/21 [place], Harrie Cazarez S, PA 06/02/21 [place], [place], DO 06/02/21 2247

## 2021-06-02 NOTE — Discharge Instructions (Addendum)
He will need to be seen and cleared by a primary care provider once your symptoms have improved before returning to sports.  I have written you prescription for Reglan which is a nausea and headache medicine.  May use this with a 25 mg Benadryl tablet as needed up to once every 8 hours for headache  You may also take Tylenol and ibuprofen as discussed below  Please use Tylenol or ibuprofen for pain.  You may use 600 mg ibuprofen every 6 hours or 1000 mg of Tylenol every 6 hours.  You may choose to alternate between the 2.  This would be most effective.  Not to exceed 4 g of Tylenol within 24 hours.  Not to exceed 3200 mg ibuprofen 24 hours.    Drink plenty of water, avoid bluelight as we discussed, avoid any additional head trauma.  Return to the ER for any new or concerning symptoms such as vomiting, worsening headache, any other new or concerning symptoms.

## 2021-06-02 NOTE — ED Notes (Signed)
EMT-P provided AVS using Teachback Method. Patient verbalizes understanding of Discharge Instructions. Opportunity for Questioning and Answers were provided by EMT-P. Patient Discharged from ED.  ? ?

## 2021-06-16 ENCOUNTER — Other Ambulatory Visit: Payer: Self-pay

## 2021-06-16 ENCOUNTER — Emergency Department (HOSPITAL_BASED_OUTPATIENT_CLINIC_OR_DEPARTMENT_OTHER)
Admission: EM | Admit: 2021-06-16 | Discharge: 2021-06-16 | Disposition: A | Discharge status: Home / Self Care | Payer: No Typology Code available for payment source | Attending: Emergency Medicine | Admitting: Emergency Medicine

## 2021-06-16 ENCOUNTER — Encounter (HOSPITAL_BASED_OUTPATIENT_CLINIC_OR_DEPARTMENT_OTHER): Payer: Self-pay

## 2021-06-16 DIAGNOSIS — Z5321 Procedure and treatment not carried out due to patient leaving prior to being seen by health care provider: Secondary | ICD-10-CM | POA: Insufficient documentation

## 2021-06-16 DIAGNOSIS — M79606 Pain in leg, unspecified: Secondary | ICD-10-CM | POA: Diagnosis not present

## 2021-06-16 DIAGNOSIS — Y9364 Activity, baseball: Secondary | ICD-10-CM | POA: Insufficient documentation

## 2021-06-16 DIAGNOSIS — W010XXA Fall on same level from slipping, tripping and stumbling without subsequent striking against object, initial encounter: Secondary | ICD-10-CM | POA: Diagnosis not present

## 2021-06-16 HISTORY — DX: Unspecified asthma, uncomplicated: J45.909

## 2021-06-16 NOTE — ED Notes (Signed)
Per Registration, Patient left Waiting Area on own accord. Patient to be discharged accordingly.

## 2021-06-16 NOTE — ED Triage Notes (Signed)
Onset yesterday.  Playing soft ball and fell into "splits" and felt a pop.  Pain down back of leg from thigh to knee.  States leg went numb.  At present throbbing pain.  Ambulatory  with limp No NAD
